# Patient Record
Sex: Male | Born: 1960
Health system: Southern US, Community
[De-identification: ages and names within clinical notes are randomized; demographics above are authoritative.]

## PROBLEM LIST (undated history)

## (undated) DIAGNOSIS — E785 Hyperlipidemia, unspecified: Secondary | ICD-10-CM

---

## 2017-10-11 ENCOUNTER — Encounter (HOSPITAL_COMMUNITY): Payer: Self-pay | Admitting: *Deleted

## 2017-10-11 ENCOUNTER — Emergency Department (HOSPITAL_BASED_OUTPATIENT_CLINIC_OR_DEPARTMENT_OTHER): Payer: BLUE CROSS/BLUE SHIELD

## 2017-10-11 ENCOUNTER — Other Ambulatory Visit: Payer: Self-pay

## 2017-10-11 ENCOUNTER — Emergency Department (HOSPITAL_COMMUNITY)
Admission: EM | Admit: 2017-10-11 | Discharge: 2017-10-11 | Disposition: A | Discharge status: Home / Self Care | Payer: BLUE CROSS/BLUE SHIELD | Attending: Emergency Medicine | Admitting: Emergency Medicine

## 2017-10-11 DIAGNOSIS — R2242 Localized swelling, mass and lump, left lower limb: Secondary | ICD-10-CM | POA: Diagnosis present

## 2017-10-11 DIAGNOSIS — R748 Abnormal levels of other serum enzymes: Secondary | ICD-10-CM | POA: Diagnosis not present

## 2017-10-11 DIAGNOSIS — M79609 Pain in unspecified limb: Secondary | ICD-10-CM | POA: Diagnosis not present

## 2017-10-11 DIAGNOSIS — M79605 Pain in left leg: Secondary | ICD-10-CM | POA: Insufficient documentation

## 2017-10-11 DIAGNOSIS — F17290 Nicotine dependence, other tobacco product, uncomplicated: Secondary | ICD-10-CM | POA: Insufficient documentation

## 2017-10-11 LAB — BASIC METABOLIC PANEL
Anion gap: 9 (ref 5–15)
Anion gap: 5 (ref 5–15)
BUN: 17 mg/dL (ref 6–20)
BUN: 12 mg/dL (ref 6–20)
CO2: 26 mmol/L (ref 22–32)
CO2: 26 mmol/L (ref 22–32)
Calcium: 9.1 mg/dL (ref 8.9–10.3)
Calcium: 8.3 mg/dL — ABNORMAL LOW (ref 8.9–10.3)
Chloride: 102 mmol/L (ref 101–111)
Chloride: 108 mmol/L (ref 101–111)
Creatinine, Ser: 1.06 mg/dL (ref 0.61–1.24)
Creatinine, Ser: 0.99 mg/dL (ref 0.61–1.24)
GFR calc Af Amer: >60 mL/min (ref >60)
GFR calc Af Amer: >60 mL/min (ref >60)
GFR calc non Af Amer: >60 mL/min (ref >60)
GFR calc non Af Amer: >60 mL/min (ref >60)
Glucose, Bld: 99 mg/dL (ref 65–99)
Glucose, Bld: 89 mg/dL (ref 65–99)
Potassium: 4.4 mmol/L (ref 3.5–5.1)
Potassium: 4.8 mmol/L (ref 3.5–5.1)
Sodium: 137 mmol/L (ref 135–145)
Sodium: 139 mmol/L (ref 135–145)

## 2017-10-11 LAB — CBC WITH DIFFERENTIAL/PLATELET
Basophils Absolute: 0 10*3/uL (ref 0.0–0.1)
Basophils Relative: 0 %
Eosinophils Absolute: 0.3 10*3/uL (ref 0.0–0.7)
Eosinophils Relative: 6 %
HCT: 43.7 % (ref 39.0–52.0)
Hemoglobin: 14.6 g/dL (ref 13.0–17.0)
Lymphocytes Relative: 53 %
Lymphs Abs: 2.5 10*3/uL (ref 0.7–4.0)
MCH: 28.7 pg (ref 26.0–34.0)
MCHC: 33.4 g/dL (ref 30.0–36.0)
MCV: 86 fL (ref 78.0–100.0)
Monocytes Absolute: 0.4 10*3/uL (ref 0.1–1.0)
Monocytes Relative: 8 %
Neutro Abs: 1.6 10*3/uL — ABNORMAL LOW (ref 1.7–7.7)
Neutrophils Relative %: 33 %
Platelets: 184 10*3/uL (ref 150–400)
RBC: 5.08 MIL/uL (ref 4.22–5.81)
RDW: 12.8 % (ref 11.5–15.5)
WBC: 4.7 10*3/uL (ref 4.0–10.5)

## 2017-10-11 LAB — URINALYSIS, ROUTINE W REFLEX MICROSCOPIC
Bilirubin Urine: NEGATIVE
Glucose, UA: NEGATIVE mg/dL
Hgb urine dipstick: NEGATIVE
Ketones, ur: NEGATIVE mg/dL
Leukocytes, UA: NEGATIVE
Nitrite: NEGATIVE
Protein, ur: NEGATIVE mg/dL
Specific Gravity, Urine: 1.014 (ref 1.005–1.030)
pH: 6 (ref 5.0–8.0)

## 2017-10-11 LAB — CK
Total CK: 14643 U/L — ABNORMAL HIGH (ref 49–397)
Total CK: 11522 U/L — ABNORMAL HIGH (ref 49–397)

## 2017-10-11 MED ORDER — SODIUM CHLORIDE 0.9 % IV BOLUS (SEPSIS)
1000.0000 mL | Freq: Once | INTRAVENOUS | Status: AC
  Administered 2017-10-11: 1000 mL via INTRAVENOUS

## 2017-10-11 MED ORDER — SODIUM CHLORIDE 0.9 % IV SOLN
1000.0000 mL | INTRAVENOUS | Status: DC
  Administered 2017-10-11: 1000 mL via INTRAVENOUS

## 2017-10-11 NOTE — ED Triage Notes (Signed)
States he worked out on [employer] and Wed. Left leg started hurting after his workout on Wed. Taking anti- imflam. And patient is getting better.

## 2017-10-11 NOTE — Discharge Instructions (Signed)
Please see the information and instructions below regarding your visit.  Your diagnoses today include:  1. Left leg pain   2. Elevated CK    There is no evidence of DVT in your left lower leg.  Your elevated creatinine kinase is trending down.  Please follow-up with your primary doctor in 24-48 hours for a repeat test of your urine as well as a reexamination of your legs.  Tests performed today include: See side panel of your discharge paperwork for testing performed today. Vital signs are listed at the bottom of these instructions.   Medications prescribed:    Take any prescribed medications only as prescribed, and any over the counter medications only as directed on the packaging.  Home care instructions:  Please follow any educational materials contained in this packet.   Follow-up instructions: Please follow-up with your primary care provider in 24-48 hours for further evaluation of your symptoms if they are not completely improved.   Return instructions:  Please return to the Emergency Department if you experience worsening symptoms.  Please return if you notice decreased urine output, change in the color of your urine, worsening pain in your legs, change in color of your legs, generalized muscle weakness or fatigue. Please return if you have any other emergent concerns.  Additional Information:   Your vital signs today were: BP 129/85    Pulse 73    Temp 98.1 F (36.7 C) (Oral)    Resp 16    Ht 5' 11.5" (1.816 m)    Wt 86.2 kg (190 lb)    SpO2 98%    BMI 26.13 kg/m  If your blood pressure (BP) was elevated on multiple readings during this visit above 130 for the top number or above 80 for the bottom number, please have this repeated by your primary care provider within one month. --------------  Thank you for allowing us to participate in your care today.

## 2017-10-11 NOTE — ED Provider Notes (Signed)
Medical screening examination/treatment/procedure(s) were conducted as a shared visit with non-physician practitioner(s) and myself.  I personally evaluated the patient during the encounter.   EKG Interpretation None     Has started new exercise regimen with a trainer.  He had exercise sessions 1 week ago and 4 days ago.  7 days ago he had intensive cardio workout in short increments and 4 days ago intense lower body strengthening.  He reports he felt fine after the first day.  He reports the lower body exercise however resulted in severe pain in his left thigh.  He reports 2 days ago it was very painful and he could not get comfortable but as of yesterday and today it is gotten considerably better.  He reports he presented today for fear of a blood clot in the leg and at his wife's urging for further evaluation.  Patient is alert and nontoxic.  Clinically well in appearance.  Normal vital signs.  Heart and lung normal.  No visible swelling to examination of bilateral lower extremities.  Distal pulses 2+ and symmetric.  No lower extremity edema.  At this time, patient denies soft tissue pain to palpation of the left thigh.  He reports previously was very tender but now only minimally uncomfortable with certain stretching maneuvers.  Patient is clinically well.  He has significant elevation in CK greater than 14,000.  DVT has been ruled out.  Patient is not on any lipid or cholesterol agents.  He is not taking any dietary supplements.  Only cause for rhabdomyolysis would be intense exercise regimen.  Fortunately, renal function is normal and patient is clinically well appearance.  Plan will be to administer 2 L of fluids and recheck CK.  If trending downward will have patient continue oral hydration and close follow-up with PCP for serial check on renal function and CK.   Arby BarrettePfeiffer, Leiya Keesey, MD 10/11/17 [phone]

## 2017-10-11 NOTE — ED Provider Notes (Signed)
[facility]   CSN: [id number] Arrival date & time: 10/11/17  0423     History   Chief Complaint Chief Complaint  Patient presents with  . Leg Swelling    HPI Kacen Sarnthony Wayne Nawabi Sr. is a 57 y.o. male.  HPI  Patient is a 57 year old male with no significant past medical history presenting for left thigh pain for the past 5 days.  Patient reports that he began a new workout regimen with a athletic trainer, and had severe pain in the left anterior and posterior thigh 1-2 days after his workout.  Patient reports that he did mini squats with the workup.  Patient reports that his pain is improving, and he has been taking ibuprofen with relief of the pain.  Patient denies any change in color to his lower extremity, swelling, pallor, pulselessness, paresthesias, a cold extremity.  Patient denies any fevers, chills, chest pain, coughing, or shortness of breath.  Patient denies any change of the color of his urine.  Patient denies any recent hospitalization, immobilization, history of DVT/PE, hormone use.  Patient does smoke cigars on weekends.  History reviewed. No pertinent past medical history.  There are no active problems to display for this patient.   History reviewed. No pertinent surgical history.     Home Medications    Prior to Admission medications   Not on File    Family History No family history on file.  Social History Social History   Tobacco Use  . Smoking status: Light Tobacco Smoker  . Smokeless tobacco: Never Used  Substance Use Topics  . Alcohol use: Yes  . Drug use: No     Allergies   Patient has no allergy information on record.   Review of Systems Review of Systems  Constitutional: Negative for chills and fever.  HENT: Negative for rhinorrhea.   Respiratory: Negative for cough and shortness of breath.   Cardiovascular: Negative for chest pain.  Gastrointestinal: Negative for nausea and  vomiting.  Genitourinary: Negative for decreased urine volume.  Musculoskeletal: Positive for arthralgias. Negative for gait problem and joint swelling.  Skin: Negative for color change and pallor.  Neurological: Negative for weakness and numbness.  All other systems reviewed and are negative.    Physical Exam Updated Vital Signs BP (!) 129/93 (BP Location: Right Arm)   Pulse 76   Temp 98.1 F (36.7 C) (Oral)   Resp 16   Ht 5' 11.5" (1.816 m)   Wt 86.2 kg (190 lb)   SpO2 99%   BMI 26.13 kg/m   Physical Exam  Constitutional: He appears well-developed and well-nourished. No distress.  HENT:  Head: Normocephalic and atraumatic.  Mouth/Throat: Oropharynx is clear and moist.  Eyes: Conjunctivae and EOM are normal. Pupils are equal, round, and reactive to light.  Neck: Normal range of motion. Neck supple.  Cardiovascular: Normal rate, regular rhythm, S1 normal and S2 normal.  No murmur heard. No lower extremity edema. Intact, 2+ DP and PT pulses bilaterally.  Pulmonary/Chest: Effort normal and breath sounds normal. He has no wheezes. He has no rales.  Abdominal: He exhibits no distension.  Musculoskeletal: Normal range of motion. He exhibits no edema or deformity.  Mild tenderness to palpation of left quadriceps. All compartments of left lower extremity soft.  Lymphadenopathy:    He has no cervical adenopathy.  Neurological: He is alert.  Cranial nerves grossly intact. Patient moves extremities symmetrically and with good coordination.  Skin: Skin is warm  and dry. No rash noted. No erythema.  Psychiatric: He has a normal mood and affect. His behavior is normal. Judgment and thought content normal.  Nursing note and vitals reviewed.    ED Treatments / Results  Labs (all labs ordered are listed, but only abnormal results are displayed) Labs Reviewed  BASIC METABOLIC PANEL  CBC WITH DIFFERENTIAL/PLATELET  CK    EKG  EKG Interpretation None       Radiology No  results found.  Procedures Procedures (including critical care time)  Medications Ordered in ED Medications - No data to display   Initial Impression / Assessment and Plan / ED Course  I have reviewed the triage vital signs and the nursing notes.  Pertinent labs & imaging results that were available during my care of the patient were reviewed by me and considered in my medical decision making (see chart for details).  Clinical Course as of Oct 11 1920  Mon Oct 11, 2017  1610 Elevated CK noted.  Clinically, patient does not appear to be in rhabdomyolysis there is no evidence of renal dysfunction.  Will order urinalysis.  Patient was independently evaluated by Dr. Sonia Baller.  Plan will be to administer 2 L of normal saline and recheck CK.  [AM]  1052 Patient reassessed.  Patient is asymptomatic of pain at this time.  We will continue to hydrate and reassess.  [AM]  1309 Patient reassessed.  Patient is asymptomatic of pain at this time.  Awaiting CK and BMP repeat results.  [AM]    Clinical Course User Index [AM] Elisha Ponder, PA-C    Patient is nontoxic-appearing, afebrile, and in no acute distress at this time.  Patient with improvement of thigh pain prior to presentation to ED.  No evidence of acute arterial occlusion at this time.  Differential diagnosis includes DVT, Baker's cyst, rhabdomyolysis, acute quadriceps strain.  Will obtain CBC, BMP, creatinine kinase, and lower extremity venous ultrasound to rule out DVT.  Patient demonstrated decline in CK and no change in renal function after 3L of normal saline. Laboratory values not clinically correlating with rhabdomyolysis. Patient not on statins. Patient instructed to follow up in 24-48 hours with PCP and have UA rechecked to ensure no myoglobinuria. Given that patient is symptomatically improved from nadir of MSK pain, do not feel that patient will develop rhabdomyolysis. Patient instructed to return for any dark colored  urine, decrease in UOP, worsening pain. Nursing notes reviewed. Vital signs reviewed. All questions answered by patient and family.  This is a shared visit with Dr. Arby Barrette. Patient was independently evaluated by this attending physician. Attending physician consulted in evaluation and discharge management.  Final Clinical Impressions(s) / ED Diagnoses   Final diagnoses:  Left leg pain  Elevated CK    ED Discharge Orders    None       Delia Chimes 10/11/17 1934    Arby Barrette, MD 10/15/17 [phone]

## 2017-10-11 NOTE — Progress Notes (Signed)
VASCULAR LAB PRELIMINARY  PRELIMINARY  PRELIMINARY  PRELIMINARY  Left lower extremity venous dulex completed.    Preliminary report:  There is no DVT or SVT noted in the left lower extremity.     Alben Jepsen, RVT 10/11/2017, 8:06 AM

## 2017-10-11 NOTE — ED Notes (Signed)
Pt presents with left leg pain since Wednesday. Pt states his wife got him to do an intense workout and now he is hurting.

## 2017-10-13 DIAGNOSIS — M6282 Rhabdomyolysis: Secondary | ICD-10-CM | POA: Diagnosis not present

## 2017-10-13 DIAGNOSIS — R748 Abnormal levels of other serum enzymes: Secondary | ICD-10-CM | POA: Diagnosis not present

## 2018-03-22 DIAGNOSIS — Z86010 Personal history of colonic polyps: Secondary | ICD-10-CM | POA: Diagnosis not present

## 2019-02-11 ENCOUNTER — Other Ambulatory Visit: Payer: Self-pay

## 2019-02-11 DIAGNOSIS — Z20822 Contact with and (suspected) exposure to covid-19: Secondary | ICD-10-CM

## 2019-02-17 LAB — NOVEL CORONAVIRUS, NAA: SARS-CoV-2, NAA: NOT DETECTED

## 2019-02-20 ENCOUNTER — Telehealth: Payer: Self-pay | Admitting: General Practice

## 2019-02-20 NOTE — Telephone Encounter (Signed)
General/Other - Results  The patient was given negative results for Covid-19

## 2019-10-30 ENCOUNTER — Ambulatory Visit
Admission: RE | Admit: 2019-10-30 | Discharge: 2019-10-30 | Disposition: A | Discharge status: Home / Self Care | Payer: BLUE CROSS/BLUE SHIELD | Source: Ambulatory Visit | Attending: Internal Medicine | Admitting: Internal Medicine

## 2019-10-30 ENCOUNTER — Other Ambulatory Visit: Payer: Self-pay | Admitting: Internal Medicine

## 2019-10-30 DIAGNOSIS — M79604 Pain in right leg: Secondary | ICD-10-CM

## 2019-11-03 ENCOUNTER — Ambulatory Visit: Payer: 59 | Attending: Internal Medicine

## 2019-11-03 DIAGNOSIS — Z23 Encounter for immunization: Secondary | ICD-10-CM

## 2019-11-03 NOTE — Progress Notes (Signed)
   [id number]  Name:  Douglas Calixto Safer Sr.    MRN: [id number] DOB: [birth year]  11/03/2019  Douglas Deleon was observed post Covid-19 immunization for 15 minutes without incident. He was provided with Vaccine Information Sheet and instruction to access the V-Safe system.   Douglas Deleon was instructed to call 911 with any severe reactions post vaccine: Marland Kitchen Difficulty breathing  . Swelling of face and throat  . A fast heartbeat  . A bad rash all over body  . Dizziness and weakness   Immunizations Administered    Name Date Dose VIS Date Route   Pfizer COVID-19 Vaccine 11/03/2019 12:28 PM 0.3 mL 07/14/2019 Intramuscular   Manufacturer: [employer], [employer]   Lot: [id number]   NDC: [address]-7

## 2019-11-28 ENCOUNTER — Ambulatory Visit: Payer: 59 | Attending: Internal Medicine

## 2019-11-28 DIAGNOSIS — Z23 Encounter for immunization: Secondary | ICD-10-CM

## 2019-11-28 NOTE — Progress Notes (Signed)
   [id number]  Name:  Douglas Frisch Sautter Sr.    MRN: [id number] DOB: 12-02-1960  11/28/2019  Douglas Deleon was observed post Covid-19 immunization for 15 minutes without incident. He was provided with Vaccine Information Sheet and instruction to access the V-Safe system.   Douglas Deleon was instructed to call 911 with any severe reactions post vaccine: Marland Kitchen Difficulty breathing  . Swelling of face and throat  . A fast heartbeat  . A bad rash all over body  . Dizziness and weakness   Immunizations Administered    Name Date Dose VIS Date Route   Pfizer COVID-19 Vaccine 11/28/2019  8:49 AM 0.3 mL 09/27/2018 Intramuscular   Manufacturer: [employer], [employer]   Lot: [id number]   NDC: [address]-6

## 2020-07-20 ENCOUNTER — Ambulatory Visit: Payer: 59

## 2021-05-11 IMAGING — CR DG LUMBAR SPINE 2-3V
3 series · 3 of 3 positions shown · non-contrast
Comparison: None.

CLINICAL DATA: Chronic right-sided low back pain and right lower
extremity pain.

EXAM:
LUMBAR SPINE - 2-3 VIEW

[t l-spine a.p.]
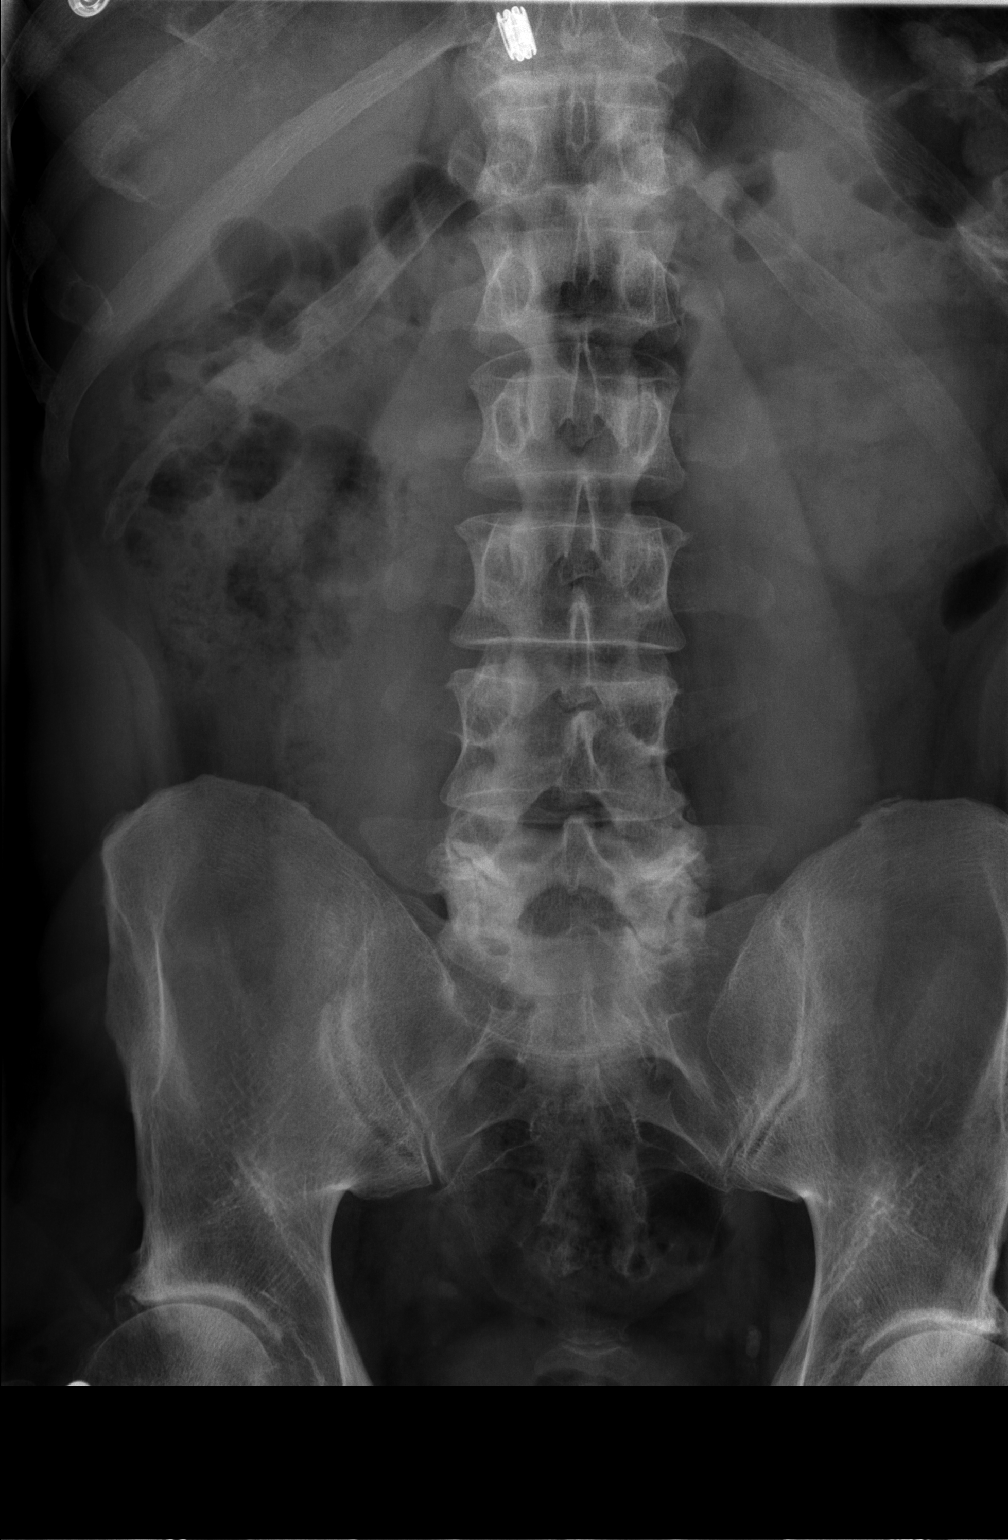

[t l-spine lat]
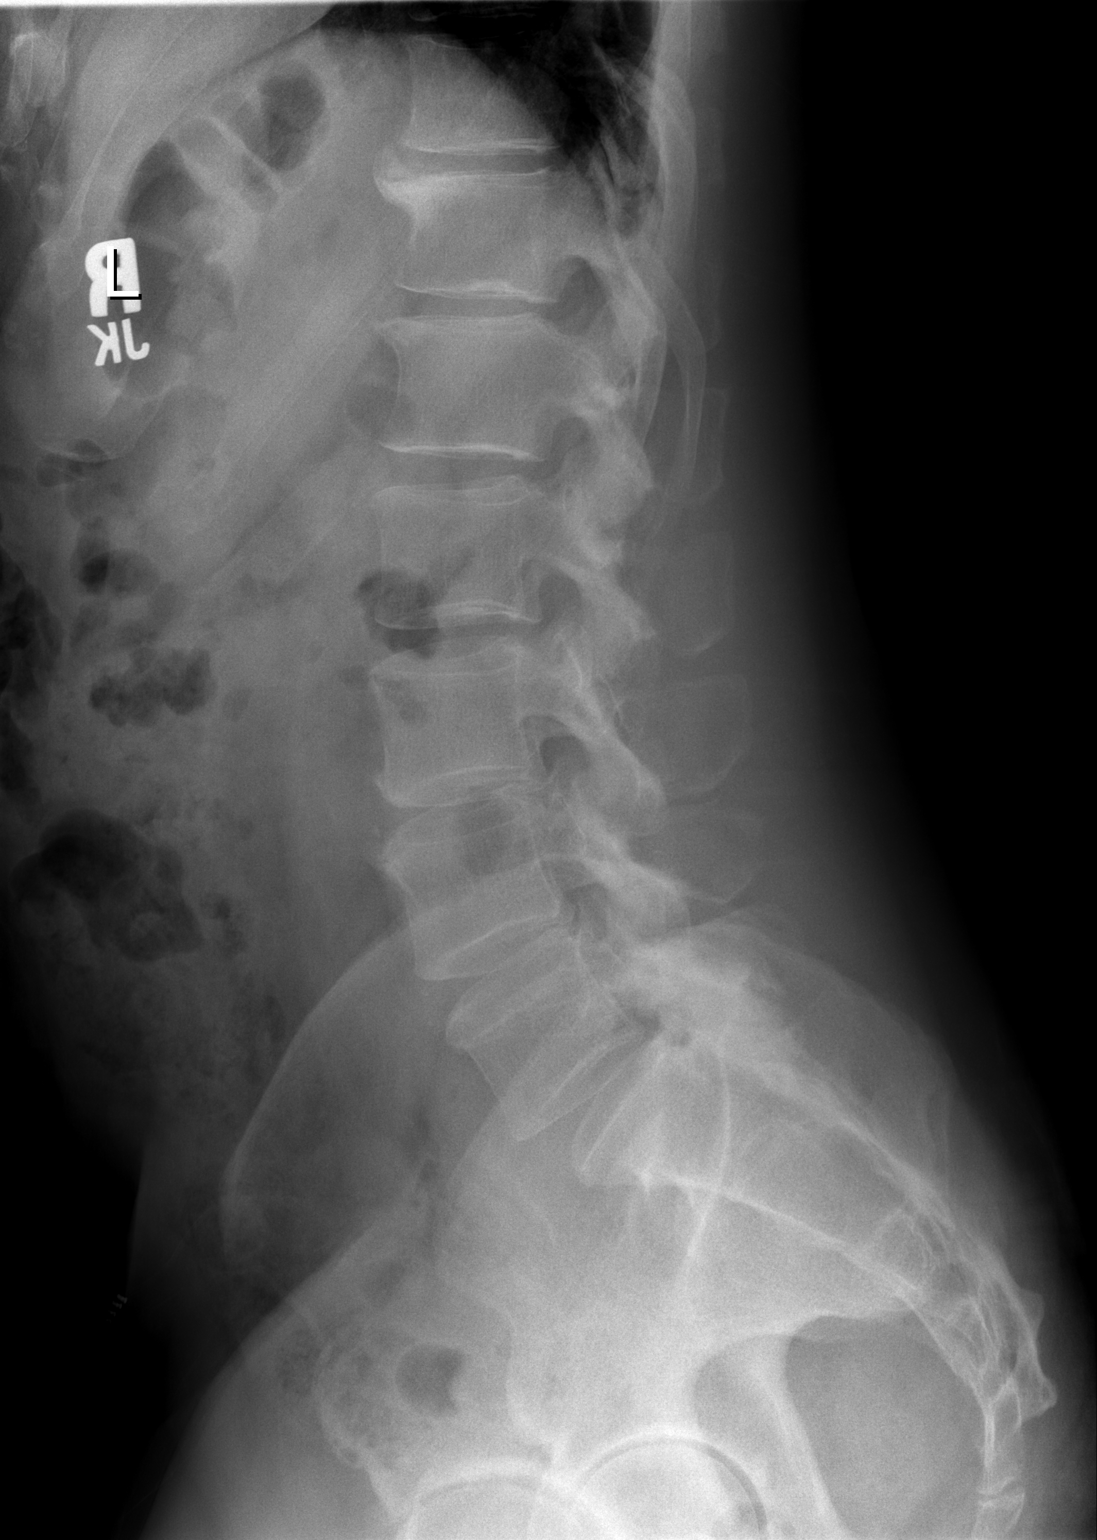

[t l-spine l5-s1 spot]
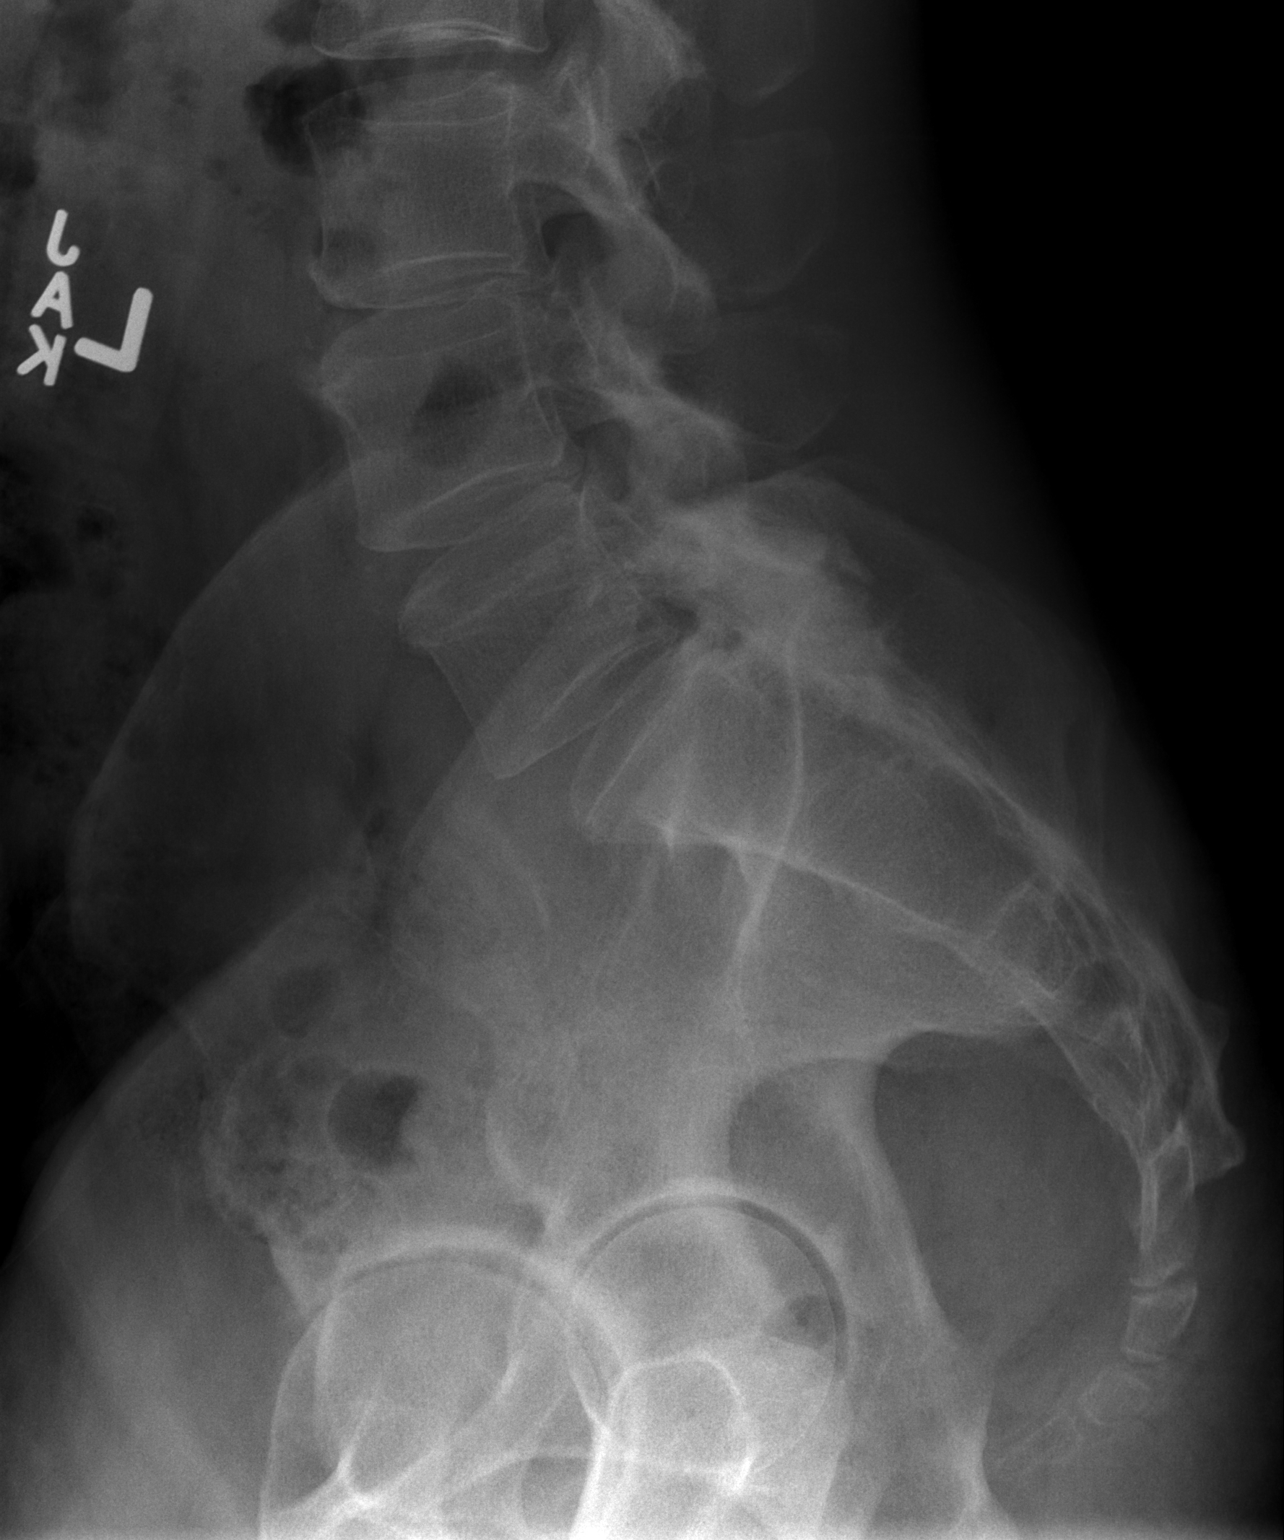

[3 of 3 positions shown; findings below may reference images not displayed]

FINDINGS: Five typical lumbar segments. No disc space narrowing. Minimal
retrolisthesis of L3 on L4. Prominent bilateral facet arthritis at
L5-S1. Sacroiliac joints appear normal.

Superior joint space narrowing of both hips.
IMPRESSION: 1. Prominent bilateral facet arthritis at L5-S1.
2. Degenerative joint disease of both hips.

## 2022-01-27 ENCOUNTER — Emergency Department: Payer: No Typology Code available for payment source

## 2022-01-27 ENCOUNTER — Other Ambulatory Visit: Payer: Self-pay

## 2022-01-27 ENCOUNTER — Encounter: Payer: Self-pay | Admitting: Emergency Medicine

## 2022-01-27 ENCOUNTER — Emergency Department
Admission: EM | Admit: 2022-01-27 | Discharge: 2022-01-27 | Disposition: A | Discharge status: Home / Self Care | Payer: No Typology Code available for payment source | Attending: Emergency Medicine | Admitting: Emergency Medicine

## 2022-01-27 DIAGNOSIS — Y92410 Unspecified street and highway as the place of occurrence of the external cause: Secondary | ICD-10-CM | POA: Insufficient documentation

## 2022-01-27 DIAGNOSIS — S4992XA Unspecified injury of left shoulder and upper arm, initial encounter: Secondary | ICD-10-CM | POA: Diagnosis present

## 2022-01-27 DIAGNOSIS — S40012A Contusion of left shoulder, initial encounter: Secondary | ICD-10-CM | POA: Insufficient documentation

## 2022-01-27 HISTORY — DX: Hyperlipidemia, unspecified: E78.5

## 2022-01-27 MED ORDER — MELOXICAM 7.5 MG PO TABS
15.0000 mg | ORAL_TABLET | Freq: Once | ORAL | Status: AC
  Administered 2022-01-27: 15 mg via ORAL
  Filled 2022-01-27: qty 2

## 2022-01-27 MED ORDER — MELOXICAM 15 MG PO TABS: 15.0000 mg | ORAL_TABLET | Freq: Every day | ORAL | 0 refills | Status: AC
